# Patient Record
Sex: Male | Born: 1995 | Race: Black or African American | Hispanic: No | Marital: Single | Smoking: Never smoker
Health system: Southern US, Community
[De-identification: ages and names within clinical notes are randomized; demographics above are authoritative.]

---
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
## 2016-03-18 ENCOUNTER — Emergency Department (HOSPITAL_COMMUNITY)
Admission: EM | Admit: 2016-03-18 | Discharge: 2016-03-18 | Disposition: A | Discharge status: Home / Self Care | Payer: Medicaid Other | Attending: Emergency Medicine | Admitting: Emergency Medicine

## 2016-03-18 ENCOUNTER — Encounter (HOSPITAL_COMMUNITY): Payer: Self-pay | Admitting: Emergency Medicine

## 2016-03-18 DIAGNOSIS — K529 Noninfective gastroenteritis and colitis, unspecified: Secondary | ICD-10-CM | POA: Insufficient documentation

## 2016-03-18 DIAGNOSIS — R109 Unspecified abdominal pain: Secondary | ICD-10-CM | POA: Diagnosis present

## 2016-03-18 DIAGNOSIS — J45909 Unspecified asthma, uncomplicated: Secondary | ICD-10-CM | POA: Diagnosis not present

## 2016-03-18 LAB — COMPREHENSIVE METABOLIC PANEL
ALK PHOS: 57 U/L (ref 38–126)
ALT: 13 U/L — AB (ref 17–63)
AST: 22 U/L (ref 15–41)
Albumin: 4.6 g/dL (ref 3.5–5.0)
Anion gap: 7 (ref 5–15)
BILIRUBIN TOTAL: 2.6 mg/dL — AB (ref 0.3–1.2)
BUN: 12 mg/dL (ref 6–20)
CALCIUM: 9.3 mg/dL (ref 8.9–10.3)
CO2: 25 mmol/L (ref 22–32)
CREATININE: 0.74 mg/dL (ref 0.61–1.24)
Chloride: 104 mmol/L (ref 101–111)
GFR calc Af Amer: >60 mL/min (ref >60)
Glucose, Bld: 92 mg/dL (ref 65–99)
POTASSIUM: 3.3 mmol/L — AB (ref 3.5–5.1)
Sodium: 136 mmol/L (ref 135–145)
TOTAL PROTEIN: 7.9 g/dL (ref 6.5–8.1)

## 2016-03-18 LAB — URINALYSIS, ROUTINE W REFLEX MICROSCOPIC
BILIRUBIN URINE: NEGATIVE
GLUCOSE, UA: NEGATIVE mg/dL
HGB URINE DIPSTICK: NEGATIVE
KETONES UR: NEGATIVE mg/dL
Leukocytes, UA: NEGATIVE
Nitrite: NEGATIVE
PROTEIN: NEGATIVE mg/dL
Specific Gravity, Urine: 1.024 (ref 1.005–1.030)
pH: 6 (ref 5.0–8.0)

## 2016-03-18 LAB — CBC
HCT: 42.7 % (ref 39.0–52.0)
Hemoglobin: 14.6 g/dL (ref 13.0–17.0)
MCH: 29.8 pg (ref 26.0–34.0)
MCHC: 34.2 g/dL (ref 30.0–36.0)
MCV: 87.1 fL (ref 78.0–100.0)
PLATELETS: 258 10*3/uL (ref 150–400)
RBC: 4.9 MIL/uL (ref 4.22–5.81)
RDW: 13 % (ref 11.5–15.5)
WBC: 6.6 10*3/uL (ref 4.0–10.5)

## 2016-03-18 LAB — LIPASE, BLOOD: Lipase: 25 U/L (ref 11–51)

## 2016-03-18 MED ORDER — PROMETHAZINE HCL 25 MG PO TABS: 25.0000 mg | ORAL_TABLET | Freq: Three times a day (TID) | ORAL | Status: AC | PRN

## 2016-03-18 MED ORDER — MORPHINE SULFATE (PF) 4 MG/ML IV SOLN
4.0000 mg | Freq: Once | INTRAVENOUS | Status: AC
  Administered 2016-03-18: 4 mg via INTRAVENOUS
  Filled 2016-03-18: qty 1

## 2016-03-18 MED ORDER — SODIUM CHLORIDE 0.9 % IV BOLUS (SEPSIS)
2000.0000 mL | Freq: Once | INTRAVENOUS | Status: AC
  Administered 2016-03-18: 2000 mL via INTRAVENOUS

## 2016-03-18 MED ORDER — SODIUM CHLORIDE 0.9 % IV BOLUS (SEPSIS): 1000.0000 mL | Freq: Once | INTRAVENOUS | Status: DC

## 2016-03-18 MED ORDER — ONDANSETRON HCL 4 MG/2ML IJ SOLN
4.0000 mg | Freq: Once | INTRAMUSCULAR | Status: AC
  Administered 2016-03-18: 4 mg via INTRAVENOUS
  Filled 2016-03-18: qty 2

## 2016-03-18 NOTE — Discharge Instructions (Signed)
Return here as needed.  Follow-up with his primary care doctor, urgent care as needed.  Slowly increase her fluid intake      Food Choices to Help Relieve Diarrhea, Pediatric When your child has watery poop (diarrhea), the foods he or she eats are important. Making sure your child drinks enough is also important. WHAT DO I NEED TO KNOW ABOUT FOOD CHOICES TO HELP RELIEVE DIARRHEA? If Your Child Is Younger Than 1 Year:  Keep breastfeeding or formula feeding as usual.  You may give your baby an ORS (oral rehydration solution). This is a drink that is sold at pharmacies, retail stores, and online.  Do not give your baby juices, sports drinks, or soda.  If your baby eats baby food, he or she can keep eating it if it does not make the watery poop worse. Choose:  Rice.  Peas.  Potatoes.  Chicken.  Eggs.  Do not give your baby foods that have a lot of fat, fiber, or sugar.  If your baby cannot eat without having watery poop, breastfeed and formula feed as usual. Give food again once the poop becomes more solid. Add one food at a time. If Your Child Is 1 Year or Older: Fluids  Give your child 1 cup (8 oz) of fluid for each watery poop episode.  Make sure your child drinks enough to keep pee (urine) clear or pale yellow.  You may give your child an ORS. This is a drink that is sold at pharmacies, retail stores, and online.  Avoid giving your child drinks with sugar, such as:  Sports drinks.  Fruit juices.  Whole milk products.  Colas. Foods  Avoid giving your child the following foods and drinks:  Drinks with caffeine.  High-fiber foods such as raw fruits and vegetables, nuts, seeds, and whole grain breads and cereals.  Foods and beverages sweetened with sugar alcohols (such as xylitol, sorbitol, and mannitol).  Give the following foods to your child:  Applesauce.  Starchy foods, such as rice, toast, pasta, low-sugar cereal, oatmeal, grits, baked potatoes,  crackers, and bagels.  When feeding your child a food made of grains, make sure it has less than 2 grams of fiber per serving.  Give your child probiotic-rich foods such as yogurt and fermented milk products.  Have your child eat small meals often.  Do not give your child foods that are very hot or cold. WHAT FOODS ARE RECOMMENDED? Only give your child foods that are okay for his or her age. If you have any questions about a food item, talk to your child's doctor. Grains Breads and products made with white flour. Noodles. White rice. Saltines. Pretzels. Oatmeal. Cold cereal. Graham crackers. Vegetables Mashed potatoes without skin. Well-cooked vegetables without seeds or skins. Strained vegetable juice. Fruits Melon. Applesauce. Banana. Fruit juice (except for prune juice) without pulp. Canned soft fruits. Meats and Other Protein Foods Hard-boiled egg. Soft, well-cooked meats. Fish, egg, or soy products made without added fat. Smooth nut butters. Dairy Breast milk or infant formula. Buttermilk. Evaporated, powdered, skim, and low-fat milk. Soy milk. Lactose-free milk. Yogurt with live active cultures. Cheese. Low-fat ice cream. Beverages Caffeine-free beverages. Rehydration beverages. Fats and Oils Oil. Butter. Cream cheese. Margarine. Mayonnaise. The items listed above may not be a complete list of recommended foods or beverages. Contact your dietitian for more options.  WHAT FOODS ARE NOT RECOMMENDED?  Grains Whole wheat or whole grain breads, rolls, crackers, or pasta. Brown or wild rice. Barley, oats, and  other whole grains. Cereals made from whole grain or bran. Breads or cereals made with seeds or nuts. Popcorn. Vegetables Raw vegetables. Fried vegetables. Beets. Broccoli. Brussels sprouts. Cabbage. Cauliflower. Collard, mustard, and turnip greens. Corn. Potato skins. Fruits All raw fruits except banana and melons. Dried fruits, including prunes and raisins. Prune juice. Fruit  juice with pulp. Fruits in heavy syrup. Meats and Other Protein Sources Fried meat, poultry, or fish. Luncheon meats (such as bologna or salami). Sausage and bacon. Hot dogs. Fatty meats. Nuts. Chunky nut butters. Dairy Whole milk. Half-and-half. Cream. Sour cream. Regular (whole milk) ice cream. Yogurt with berries, dried fruit, or nuts. Beverages Beverages with caffeine, sorbitol, or high fructose corn syrup. Fats and Oils Fried foods. Greasy foods. Other Foods sweetened with the artificial sweeteners sorbitol or xylitol. Honey. Foods with caffeine, sorbitol, or high fructose corn syrup. The items listed above may not be a complete list of foods and beverages to avoid. Contact your dietitian for more information.   This information is not intended to replace advice given to you by your health care provider. Make sure you discuss any questions you have with your health care provider.   Document Released: 02/13/2008 Document Revised: 09/17/2014 Document Reviewed: 08/03/2013 Elsevier Interactive Patient Education [employer]2016 [employer] Inc.

## 2016-03-18 NOTE — ED Notes (Signed)
Pt states that he started having N/V with abdominal pain this morning after eating [employer] last night. Alert and oriented.

## 2016-03-18 NOTE — ED Provider Notes (Signed)
CSN: [id number]     Arrival date & time 03/18/16  1804 History   First MD Initiated Contact with Patient 03/18/16 1823     Chief Complaint  Patient presents with  . Abdominal Pain     (Consider location/radiation/quality/duration/timing/severity/associated sxs/prior Treatment) HPI Patient presents to the emergency department with a dominant discomfort with nausea, vomiting, diarrhea following eating pizza last night.  He states that he woke up with nausea, vomiting, diarrhea.  The patient states that nothing seems make his condition, better or worse.  Patient did not take any medications prior to arrival.  Patient states he has some abdominal cramping.  Patient [facility] The patient denies chest pain, shortness of breath, headache,blurred vision, neck pain, fever, cough, weakness, numbness, dizziness, anorexia, edema, abdominal pain,rash, back pain, dysuria, hematemesis, bloody stool, near syncope, or syncope. Past Medical History  Diagnosis Date  . Asthma    History reviewed. No pertinent past surgical history. Family History  Problem Relation Age of Onset  . Sudden death Neg Hx   . Heart attack Neg Hx    Social History  Substance Use Topics  . Smoking status: Never Smoker   . Smokeless tobacco: None  . Alcohol Use: None    Review of Systems  All other systems negative except as documented in the HPI. All pertinent positives and negatives as reviewed in the HPI.  Allergies  Review of patient's allergies indicates no known allergies.  Home Medications   Prior to Admission medications   Not on File   BP 124/84 mmHg  Pulse 89  Temp(Src) 99 F (37.2 C) (Oral)  Resp 16  SpO2 100% Physical Exam  Constitutional: He is oriented to person, place, and time. He appears well-developed and well-nourished. No distress.  HENT:  Head: Normocephalic and atraumatic.  Mouth/Throat: Oropharynx is clear and moist.  Eyes: Pupils are equal, round, and  reactive to light.  Neck: Normal range of motion. Neck supple.  Cardiovascular: Normal rate, regular rhythm and normal heart sounds.  Exam reveals no gallop and no friction rub.   No murmur heard. Pulmonary/Chest: Effort normal and breath sounds normal. No respiratory distress. He has no wheezes.  Abdominal: Soft. Bowel sounds are normal. He exhibits no distension. There is no tenderness. There is no rebound and no guarding.  Neurological: He is alert and oriented to person, place, and time. He exhibits normal muscle tone. Coordination normal.  Skin: Skin is warm and dry. No rash noted. No erythema.  Psychiatric: He has a normal mood and affect. His behavior is normal.  Nursing note and vitals reviewed.   ED Course  Procedures (including critical care time) Labs Review Labs Reviewed  COMPREHENSIVE METABOLIC PANEL - Abnormal; Notable for the following:    Potassium 3.3 (*)    ALT 13 (*)    Total Bilirubin 2.6 (*)    All other components within normal limits  LIPASE, BLOOD  CBC  URINALYSIS, ROUTINE W REFLEX MICROSCOPIC (NOT AT [facility])    Imaging Review No results found. I have personally reviewed and evaluated these images and lab results as part of my medical decision-making.  Patient will be treated for gastroenteritis is feeling better following IV fluids and medications.  Patient is advised to return here as needed.  Slowly increase his fluid intake.    Charlestine NightChristopher Areil Ottey, PA-C 03/18/16 2040  Benjiman CoreNathan Pickering, MD 03/18/16 [phone]

## 2016-03-18 NOTE — ED Notes (Signed)
Offered pt 8 oz ginger ale and 2 pack of gram crackers, pt has ate and drank without reporting any pain, nausea or having episode of emesis.

## 2019-11-05 ENCOUNTER — Emergency Department (HOSPITAL_COMMUNITY): Payer: Self-pay

## 2019-11-05 ENCOUNTER — Encounter (HOSPITAL_COMMUNITY): Payer: Self-pay

## 2019-11-05 ENCOUNTER — Emergency Department (HOSPITAL_COMMUNITY)
Admission: EM | Admit: 2019-11-05 | Discharge: 2019-11-05 | Disposition: A | Discharge status: Home / Self Care | Payer: Self-pay | Attending: Emergency Medicine | Admitting: Emergency Medicine

## 2019-11-05 ENCOUNTER — Other Ambulatory Visit: Payer: Self-pay

## 2019-11-05 DIAGNOSIS — L03011 Cellulitis of right finger: Secondary | ICD-10-CM | POA: Insufficient documentation

## 2019-11-05 DIAGNOSIS — S61031A Puncture wound without foreign body of right thumb without damage to nail, initial encounter: Secondary | ICD-10-CM | POA: Insufficient documentation

## 2019-11-05 DIAGNOSIS — W5501XA Bitten by cat, initial encounter: Secondary | ICD-10-CM | POA: Insufficient documentation

## 2019-11-05 DIAGNOSIS — Y939 Activity, unspecified: Secondary | ICD-10-CM | POA: Insufficient documentation

## 2019-11-05 DIAGNOSIS — L0390 Cellulitis, unspecified: Secondary | ICD-10-CM

## 2019-11-05 DIAGNOSIS — Y999 Unspecified external cause status: Secondary | ICD-10-CM | POA: Insufficient documentation

## 2019-11-05 DIAGNOSIS — J45909 Unspecified asthma, uncomplicated: Secondary | ICD-10-CM | POA: Insufficient documentation

## 2019-11-05 DIAGNOSIS — Y929 Unspecified place or not applicable: Secondary | ICD-10-CM | POA: Insufficient documentation

## 2019-11-05 MED ORDER — AMOXICILLIN-POT CLAVULANATE 875-125 MG PO TABS: 1.0000 | ORAL_TABLET | Freq: Two times a day (BID) | ORAL | 0 refills | Status: AC

## 2019-11-05 NOTE — ED Provider Notes (Addendum)
[facility] DEPT Provider Note   CSN: [id number] Arrival date & time: 11/05/19  1906     History Chief Complaint  Patient presents with  . Foreign Body in [place] is a 24 y.o. male who is presenting for evaluation of pain in his right thumb following being bit and clawed by his cat last night. He denies any drainage or fever however is experiencing quite a bit of pain from it. He has been taking ibuprofen and tylenol with a little relief of symptoms. Reports the cat is up to date on vaccines.  Past Medical History:  Diagnosis Date  . Asthma     Patient Active Problem List   Diagnosis Date Noted  . Sports physical 06/07/2011    History reviewed. No pertinent surgical history.     Family History  Problem Relation Age of Onset  . Sudden death Neg Hx   . Heart attack Neg Hx     Social History   Tobacco Use  . Smoking status: Never Smoker  Substance Use Topics  . Alcohol use: Not on file  . Drug use: Not on file    Home Medications Prior to Admission medications   Medication Sig Start Date End Date Taking? Authorizing Provider  amoxicillin-clavulanate (AUGMENTIN) 875-125 MG tablet Take 1 tablet by mouth every 12 (twelve) hours for 10 days. 11/05/19 11/15/19  Mitzi Hansen, MD  promethazine (PHENERGAN) 25 MG tablet Take 1 tablet (25 mg total) by mouth every 8 (eight) hours as needed for nausea or vomiting. 03/18/16   Dalia Heading, PA-C    Allergies    Patient has no known allergies.  Review of Systems   Review of Systems  Constitutional: Negative.   HENT: Negative.   Respiratory: Negative.   Cardiovascular: Negative.   Gastrointestinal: Negative.   Genitourinary: Negative.   Musculoskeletal: Negative.   Skin: Positive for wound.  Neurological: Negative.   Hematological: Negative.   Psychiatric/Behavioral: Negative.     Physical Exam Updated Vital Signs BP (!) 135/95 (BP Location: Left Arm)   Pulse 86    Temp 98.6 F (37 C) (Oral)   Resp 16   SpO2 100%   Physical Exam Constitutional:      General: He is not in acute distress.    Appearance: He is not ill-appearing.  HENT:     Head: Atraumatic.  Cardiovascular:     Rate and Rhythm: Normal rate and regular rhythm.  Pulmonary:     Effort: Pulmonary effort is normal.     Breath sounds: Normal breath sounds.  Abdominal:     General: Bowel sounds are normal.  Musculoskeletal:        General: No deformity.  Skin:    Comments: 2 puncture like wounds to the left thumb with surrounding induration and minimal erythema. No drainage. Small amount of pain on palpation. Joint nontender. ROM intact.  Neurological:     General: No focal deficit present.     Mental Status: He is alert.  Psychiatric:        Mood and Affect: Mood normal.     ED Results / Procedures / Treatments   Labs (all labs ordered are listed, but only abnormal results are displayed) Labs Reviewed - No data to display  EKG None  Radiology DG Hand Complete Right  Result Date: 11/05/2019 CLINICAL DATA:  Foreign body. EXAM: RIGHT HAND - COMPLETE 3+ VIEW COMPARISON:  None. FINDINGS: There is no evidence of fracture or dislocation.  There is no evidence of arthropathy or other focal bone abnormality. Soft tissues are unremarkable. There is no radiopaque foreign body. IMPRESSION: Negative. Electronically Signed   By: Katherine Mantle M.D.   On: 11/05/2019 19:51    Procedures Procedures (including critical care time)  Medications Ordered in ED Medications - No data to display  ED Course  I have reviewed the triage vital signs and the nursing notes.  Pertinent labs & imaging results that were available during my care of the patient were reviewed by me and considered in my medical decision making (see chart for details).    MDM Rules/Calculators/A&P                      24 yo male who is presenting for evaluation of a cat bite that occurred last evening. Cat  belongs to the patient and he reports that it is up to date on vaccination and that it bit him when it was fighting with another cat.  Likely has beginning stages of cellulitis. No foreign body evident on imaging. No evidence of joint involvement. Will give 10d course of Augmentin with strict return precautions. He will need to follow up with primary care in 5-10d to progressive healing. Final Clinical Impression(s) / ED Diagnoses Final diagnoses:  Cellulitis, unspecified cellulitis site  Cat bite, initial encounter    Rx / DC Orders ED Discharge Orders         Ordered    amoxicillin-clavulanate (AUGMENTIN) 875-125 MG tablet  Every 12 hours     11/05/19 2059           Elige Radon, MD 11/05/19 2153    Elige Radon, MD 11/05/19 2154    Lorre Nick, MD 11/06/19 1034

## 2019-11-05 NOTE — Discharge Instructions (Addendum)
It was a pleasure meeting you today. Fortunately, your xray did not show a foreign body and most likely it has started to get infected. I will send a 10 day course of Augmentin (antibiotic) to your pharmacy. If it does not appear to be healing or you develop fever, drainage or worsening swelling/redness of your thumb, please be re-evaluated. I would recommend that you be seen by your primary care doctor in a week to ensure good healing.

## 2019-11-05 NOTE — ED Triage Notes (Signed)
Pt presents with c/o foreign body in his right thumb. Pt reports his cat bit him and he believes there is a tooth stuck in there. No bleeding noted.

## 2019-11-05 NOTE — ED Provider Notes (Signed)
I saw and evaluated the patient, reviewed the resident's note and I agree with the findings and plan.  EKG:    Patient here after sustaining bite wound to right thumb from his cat.  No obvious evidence of infection.  X-ray negative.  Will place on Augmentin and discharged   Lorre Nick, MD 11/05/19 2054

## 2021-07-26 IMAGING — CR DG HAND COMPLETE 3+V*R*
3 series · 3 of 3 positions shown · non-contrast
Comparison: None.

CLINICAL DATA: Foreign body.

EXAM:
RIGHT HAND - COMPLETE 3+ VIEW

[x hand pa right]
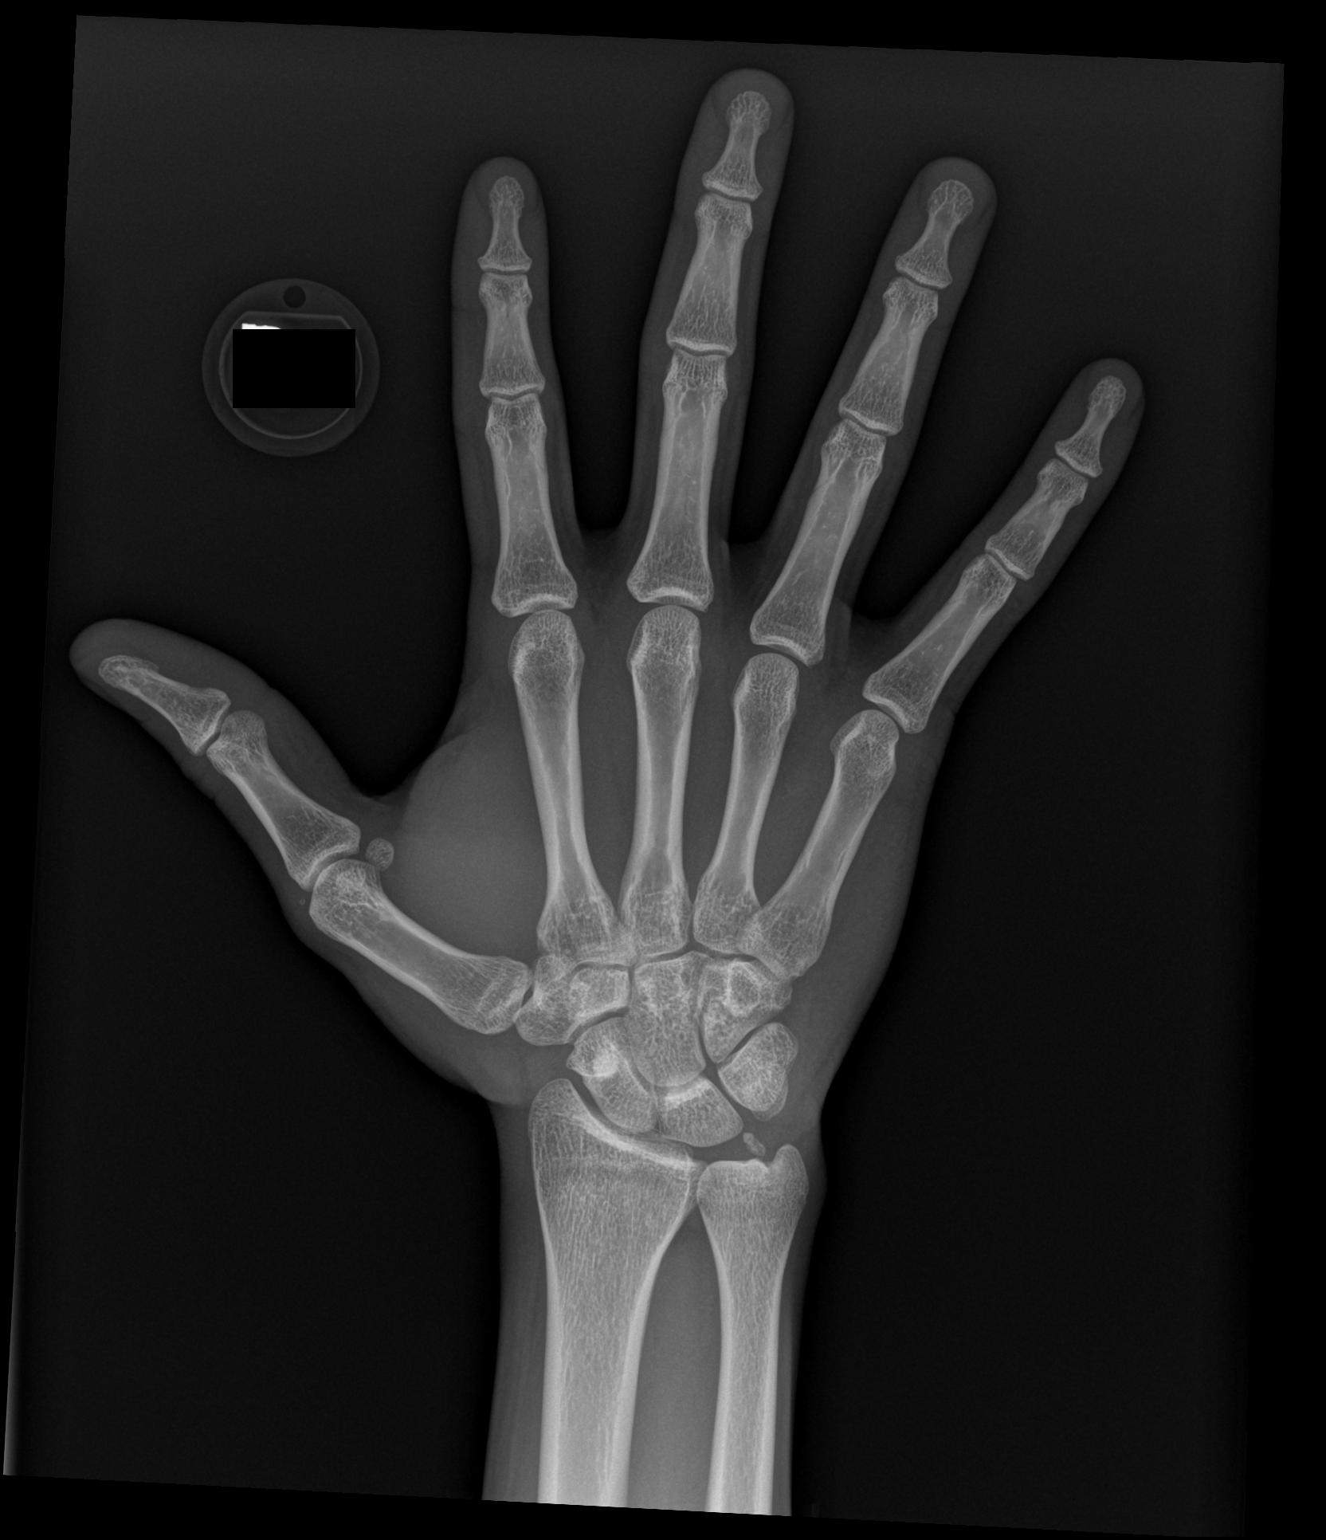

[x hand obl right]
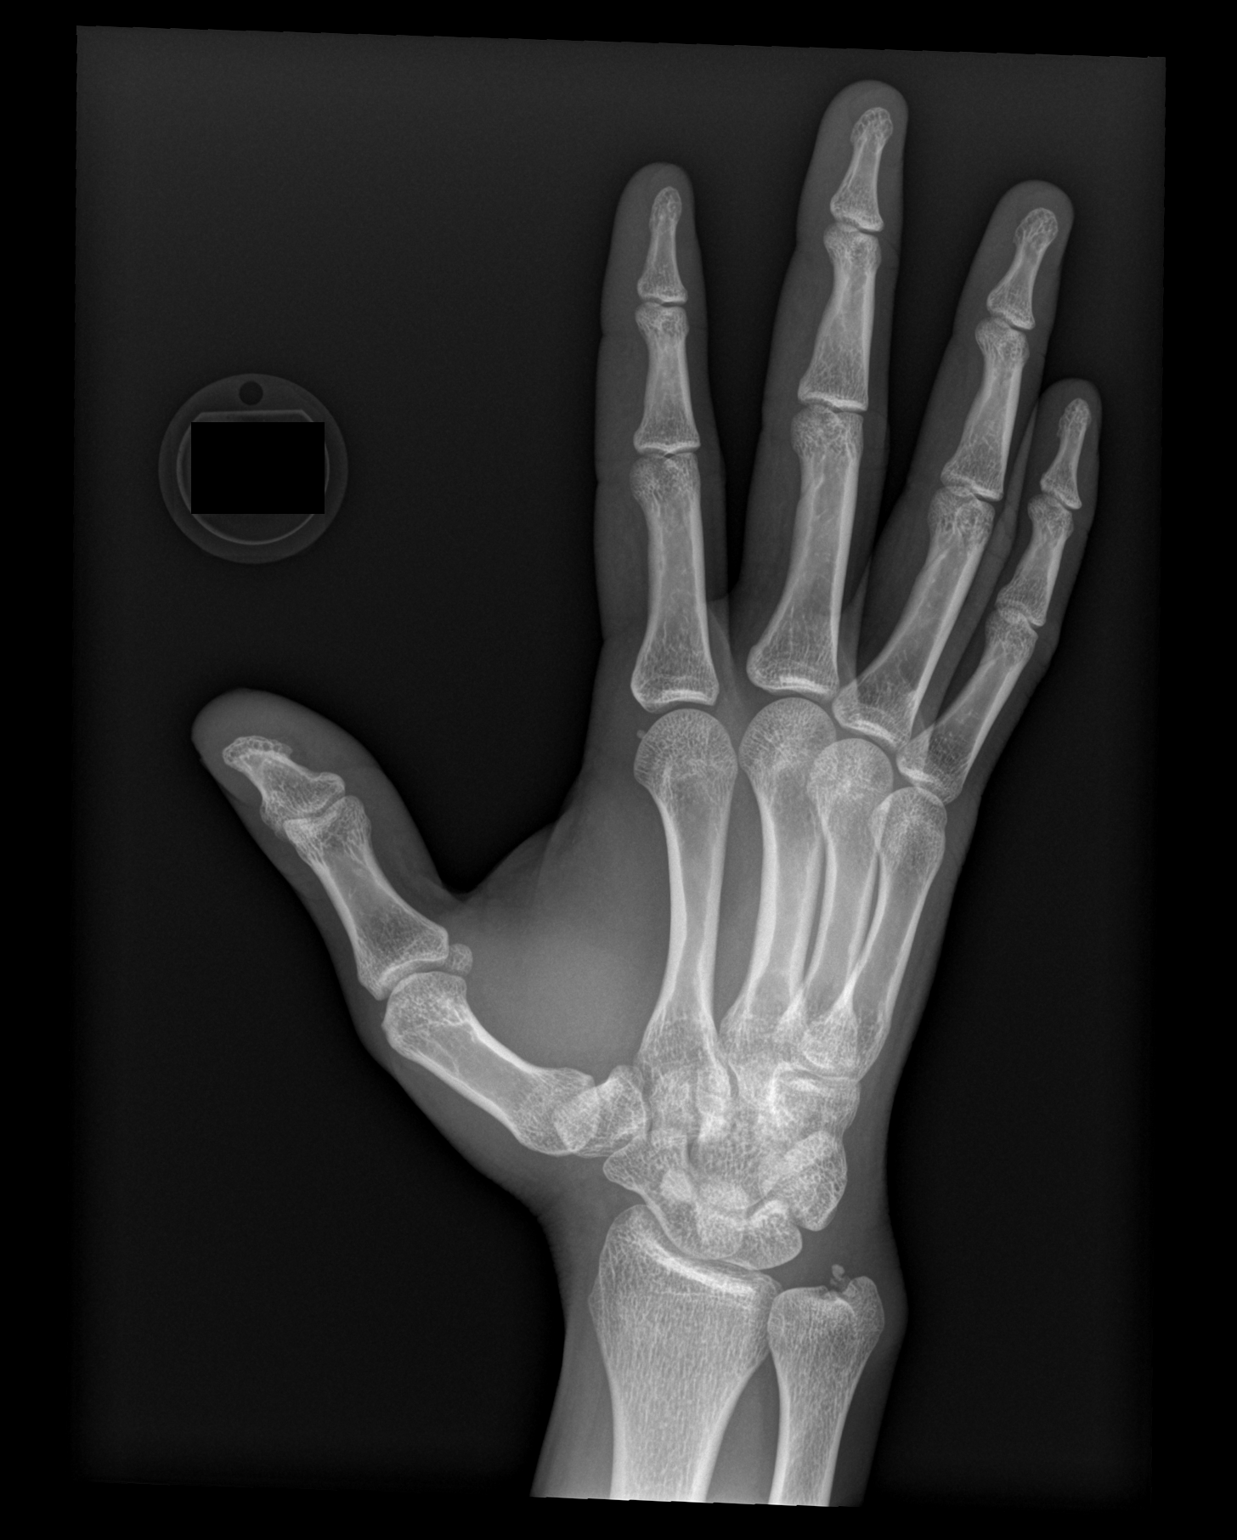

[x hand lat right]
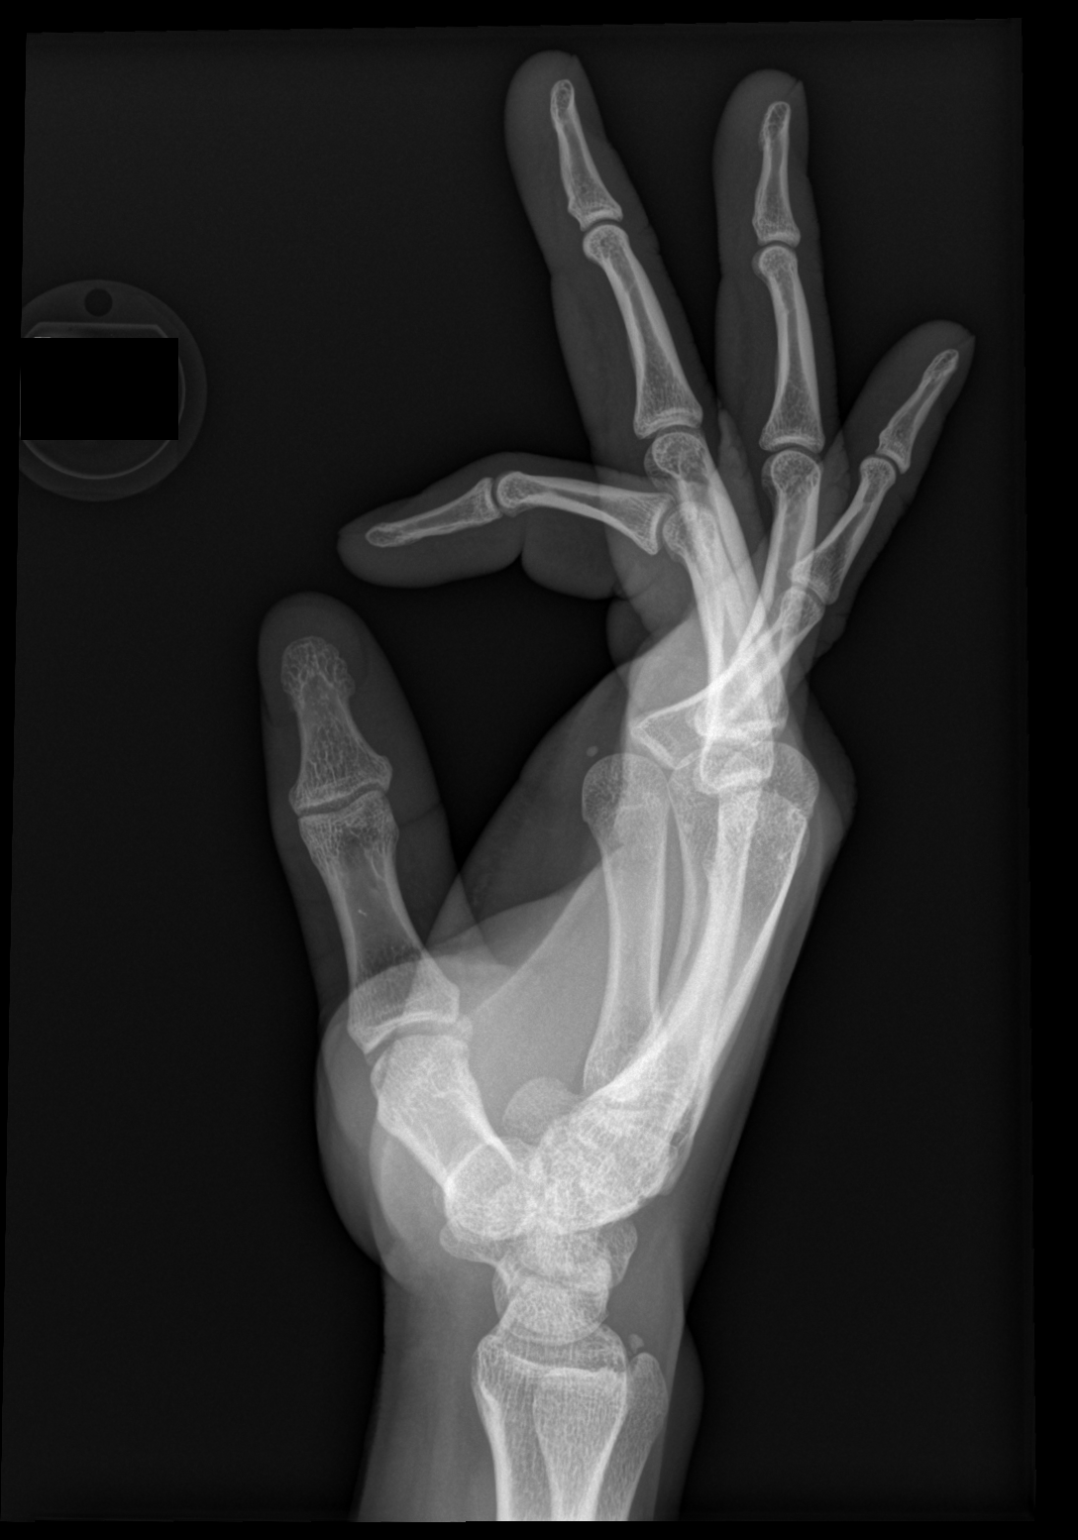

[3 of 3 positions shown; findings below may reference images not displayed]

FINDINGS: There is no evidence of fracture or dislocation. There is no
evidence of arthropathy or other focal bone abnormality. Soft
tissues are unremarkable. There is no radiopaque foreign body.
IMPRESSION: Negative.
